# Patient Record
Sex: Male | Born: 1990 | Race: White | Hispanic: No | Smoking: Never smoker
Health system: Southern US, Community
[De-identification: ages and names within clinical notes are randomized; demographics above are authoritative.]

## PROBLEM LIST (undated history)

## (undated) DIAGNOSIS — E079 Disorder of thyroid, unspecified: Secondary | ICD-10-CM

---

## 2010-09-10 ENCOUNTER — Emergency Department (HOSPITAL_BASED_OUTPATIENT_CLINIC_OR_DEPARTMENT_OTHER)
Admission: EM | Admit: 2010-09-10 | Discharge: 2010-09-10 | Payer: Self-pay | Source: Home / Self Care | Admitting: Emergency Medicine

## 2010-11-24 LAB — DIFFERENTIAL
Lymphocytes Relative: 29 % (ref 12–46)
Lymphs Abs: 2.8 10*3/uL (ref 0.7–4.0)
Monocytes Absolute: 1.1 10*3/uL — ABNORMAL HIGH (ref 0.1–1.0)
Monocytes Relative: 11 % (ref 3–12)

## 2010-11-24 LAB — COMPREHENSIVE METABOLIC PANEL
AST: 30 U/L (ref 0–37)
Alkaline Phosphatase: 90 U/L (ref 39–117)
CO2: 27 mEq/L (ref 19–32)
Creatinine, Ser: 1.1 mg/dL (ref 0.4–1.5)
GFR calc Af Amer: >60 mL/min (ref >60)
GFR calc non Af Amer: >60 mL/min (ref >60)
Potassium: 4.2 mEq/L (ref 3.5–5.1)
Sodium: 147 mEq/L — ABNORMAL HIGH (ref 135–145)
Total Bilirubin: 1 mg/dL (ref 0.3–1.2)

## 2010-11-24 LAB — CBC
HCT: 45.6 % (ref 39.0–52.0)
Hemoglobin: 16.8 g/dL (ref 13.0–17.0)
MCHC: 36.8 g/dL — ABNORMAL HIGH (ref 30.0–36.0)
MCV: 80.9 fL (ref 78.0–100.0)
Platelets: 204 10*3/uL (ref 150–400)
RDW: 13.5 % (ref 11.5–15.5)

## 2010-11-24 LAB — URINALYSIS, ROUTINE W REFLEX MICROSCOPIC
Glucose, UA: NEGATIVE mg/dL
Hgb urine dipstick: NEGATIVE
pH: 6 (ref 5.0–8.0)

## 2010-11-24 LAB — LIPASE, BLOOD: Lipase: 73 U/L (ref 23–300)

## 2015-09-24 ENCOUNTER — Emergency Department (HOSPITAL_BASED_OUTPATIENT_CLINIC_OR_DEPARTMENT_OTHER)
Admission: EM | Admit: 2015-09-24 | Discharge: 2015-09-24 | Disposition: A | Discharge status: Home / Self Care | Payer: Worker's Compensation | Attending: Emergency Medicine | Admitting: Emergency Medicine

## 2015-09-24 ENCOUNTER — Encounter (HOSPITAL_BASED_OUTPATIENT_CLINIC_OR_DEPARTMENT_OTHER): Payer: Self-pay | Admitting: *Deleted

## 2015-09-24 ENCOUNTER — Emergency Department (HOSPITAL_BASED_OUTPATIENT_CLINIC_OR_DEPARTMENT_OTHER): Payer: Worker's Compensation

## 2015-09-24 DIAGNOSIS — S5002XA Contusion of left elbow, initial encounter: Secondary | ICD-10-CM | POA: Insufficient documentation

## 2015-09-24 DIAGNOSIS — Y9289 Other specified places as the place of occurrence of the external cause: Secondary | ICD-10-CM | POA: Insufficient documentation

## 2015-09-24 DIAGNOSIS — W01198A Fall on same level from slipping, tripping and stumbling with subsequent striking against other object, initial encounter: Secondary | ICD-10-CM | POA: Diagnosis not present

## 2015-09-24 DIAGNOSIS — S060X1A Concussion with loss of consciousness of 30 minutes or less, initial encounter: Secondary | ICD-10-CM | POA: Diagnosis not present

## 2015-09-24 DIAGNOSIS — Y998 Other external cause status: Secondary | ICD-10-CM | POA: Diagnosis not present

## 2015-09-24 DIAGNOSIS — S4491XA Injury of unspecified nerve at shoulder and upper arm level, right arm, initial encounter: Secondary | ICD-10-CM | POA: Insufficient documentation

## 2015-09-24 DIAGNOSIS — Z79899 Other long term (current) drug therapy: Secondary | ICD-10-CM | POA: Diagnosis not present

## 2015-09-24 DIAGNOSIS — S4492XA Injury of unspecified nerve at shoulder and upper arm level, left arm, initial encounter: Secondary | ICD-10-CM

## 2015-09-24 DIAGNOSIS — S39012A Strain of muscle, fascia and tendon of lower back, initial encounter: Secondary | ICD-10-CM | POA: Insufficient documentation

## 2015-09-24 DIAGNOSIS — Y9389 Activity, other specified: Secondary | ICD-10-CM | POA: Insufficient documentation

## 2015-09-24 DIAGNOSIS — S0990XA Unspecified injury of head, initial encounter: Secondary | ICD-10-CM | POA: Diagnosis present

## 2015-09-24 MED ORDER — KETOROLAC TROMETHAMINE 60 MG/2ML IM SOLN
60.0000 mg | Freq: Once | INTRAMUSCULAR | Status: AC
  Administered 2015-09-24: 60 mg via INTRAMUSCULAR
  Filled 2015-09-24: qty 2

## 2015-09-24 MED ORDER — ORPHENADRINE CITRATE ER 100 MG PO TB12: 100.0000 mg | ORAL_TABLET | Freq: Two times a day (BID) | ORAL | Status: AC

## 2015-09-24 MED ORDER — NAPROXEN 500 MG PO TABS: 500.0000 mg | ORAL_TABLET | Freq: Two times a day (BID) | ORAL | Status: AC

## 2015-09-24 MED ORDER — ACETAMINOPHEN 500 MG PO TABS
1000.0000 mg | ORAL_TABLET | Freq: Once | ORAL | Status: AC
  Administered 2015-09-24: 1000 mg via ORAL
  Filled 2015-09-24: qty 2

## 2015-09-24 MED ORDER — CYCLOBENZAPRINE HCL 10 MG PO TABS
10.0000 mg | ORAL_TABLET | Freq: Once | ORAL | Status: AC
  Administered 2015-09-24: 10 mg via ORAL
  Filled 2015-09-24: qty 1

## 2015-09-24 MED FILL — NAPROXEN 500 MG TABLET: 500 | 15 days supply | Qty: 30 | Fill #0

## 2015-09-24 MED FILL — ORPHENADRINE 100 MG TAB SA: 100 | 15 days supply | Qty: 30 | Fill #0

## 2015-09-24 NOTE — ED Notes (Signed)
Ice pack given

## 2015-09-24 NOTE — ED Notes (Signed)
States he was getting out of his truck and hit his head on ramp of truck. +LOC and states he was out about 3 min. C/o h/a, lower right back pain, and left elbow pain. Pt ambulatory to stretcher from w/c.

## 2015-09-24 NOTE — ED Provider Notes (Signed)
CSN: [id number]     Arrival date & time 09/24/15  0901 History   First MD Initiated Contact with Patient 09/24/15 [phone]     No chief complaint on file.    (Consider location/radiation/quality/duration/timing/severity/associated sxs/prior Treatment) HPI Patient reports he got out of his truck at work and slipped on ice. He fell backwards and hit his head on the running board of his truck and then hit his head on the ground. He reports that he was knocked out for about 3 minutes. He states he has a bad headache and his eyes are sensitive to light. He reports he has had no vomiting but has felt nauseated. He reports he also has a lot of pain in his left elbow. He is not sure if this is from tightly gripping the items that he had in his arm at the time of his fall or from directly striking it. He however reports that it hurts a lot if he moves his shoulder or his elbow and he is getting pain and tingling radiating down his forearm towards his hand. He reports also he has pain in his lower back more to the right than the left. This is exacerbated by movements. The patient has been able to ambulate and weight-bear although it is painful. Denies weakness or numbness into his legs. History reviewed. No pertinent past medical history. History reviewed. No pertinent past surgical history. No family history on file. Social History  Substance Use Topics  . Smoking status: Never Smoker   . Smokeless tobacco: None  . Alcohol Use: None    Review of Systems 10 Systems reviewed and are negative for acute change except as noted in the HPI.    Allergies  Review of patient's allergies indicates no known allergies.  Home Medications   Prior to Admission medications   Medication Sig Start Date End Date Taking? Authorizing Provider  citalopram (CELEXA) 10 MG tablet Take 10 mg by mouth daily.   Yes Historical Provider, MD  naproxen (NAPROSYN) 500 MG tablet Take 1 tablet (500 mg total) by mouth 2 (two) times  daily. 09/24/15   Arby Barrette, MD  orphenadrine (NORFLEX) 100 MG tablet Take 1 tablet (100 mg total) by mouth 2 (two) times daily. 09/24/15   Arby Barrette, MD   BP 123/80 mmHg  Pulse 66  Temp(Src) 98.4 F (36.9 C) (Oral)  Resp 16  Ht 5\' 8"  (1.727 m)  Wt 215 lb (97.523 kg)  BMI 32.70 kg/m2  SpO2 100% Physical Exam  Constitutional: He is oriented to person, place, and time. He appears well-developed and well-nourished.  HENT:  Head: Normocephalic and atraumatic.  Right Ear: External ear normal.  Left Ear: External ear normal.  Nose: Nose normal.  Mouth/Throat: Oropharynx is clear and moist.  Posterior occipital skull is tender but no palpable abnormality.  Eyes: EOM are normal. Pupils are equal, round, and reactive to light.  Neck: Neck supple.  C-spine tenderness at approximately C4 and C5.  Cardiovascular: Normal rate, regular rhythm, normal heart sounds and intact distal pulses.   Pulmonary/Chest: Effort normal and breath sounds normal. No respiratory distress. He exhibits no tenderness.  Abdominal: Soft. Bowel sounds are normal. He exhibits no distension. There is no tenderness.  Musculoskeletal: He exhibits tenderness. He exhibits no edema.  Pain with range of motion at the left elbow. No obvious deformity. No effusion at this time. Extremities neurovascularly intact. Patient is able to perform range of motion of the shoulder with abduction to 90+ degrees bilaterally  without difficulty. He is able to flex and extend at the elbow although this is painful. Bony point tenderness at the olecranon. Patient can stand and ambulate. He does endorse reproducible pain at the SI joint and the top of the iliac crest on the left. No visible abrasions or contusions to soft tissues.  Neurological: He is alert and oriented to person, place, and time. He has normal strength. Coordination normal. GCS eye subscore is 4. GCS verbal subscore is 5. GCS motor subscore is 6.  Normal cognitive function.  Lower extremity strength testing is 5 out of 5 in flexion and extension. Patient can elevate each extremity independently off of the bed. No endorsement of sensory differential to light touch. Upper extremity normal use of right upper extremity. Some range of motion limitations of left upper extremity due to pain.  Skin: Skin is warm, dry and intact.  Psychiatric: He has a normal mood and affect.    ED Course  Procedures (including critical care time) Labs Review Labs Reviewed - No data to display  Imaging Review Dg Lumbar Spine Complete  09/24/2015  CLINICAL DATA:  Status post fall on ice getting out of his truck this morning; low back pain ; unable to lie on the left side. EXAM: LUMBAR SPINE - COMPLETE 4+ VIEW COMPARISON:  Abdominal and pelvic CT scan of September 10, 2010 FINDINGS: The lumbar vertebral bodies are preserved in height. The pedicles and transverse processes are intact. There is no spondylolisthesis. The intervertebral disc space heights are well maintained. There is no significant facet joint hypertrophy. IMPRESSION: There is no acute bony abnormality of the lumbar spine. No significant degenerative change is observed either. Electronically Signed   By: David  SwazilandJordan M.D.   On: 09/24/2015 09:53   Dg Elbow Complete Left  09/24/2015  CLINICAL DATA:  Fall.  Initial evaluation. EXAM: LEFT ELBOW - COMPLETE 3+ VIEW COMPARISON:  None. FINDINGS: No acute bony or joint abnormality identified. No evidence of fracture or dislocation. IMPRESSION: No acute abnormality . Electronically Signed   By: Maisie Fushomas  Register   On: 09/24/2015 09:51   Ct Head Wo Contrast  09/24/2015  CLINICAL DATA:  Initial encounter for States he was getting out of his truck and hit his head on ramp of truck. +LOC and states he was out about 3 min. C/o h/a, lower right back pain, and left elbow pain. EXAM: CT HEAD WITHOUT CONTRAST CT CERVICAL SPINE WITHOUT CONTRAST TECHNIQUE: Multidetector CT imaging of the head and  cervical spine was performed following the standard protocol without intravenous contrast. Multiplanar CT image reconstructions of the cervical spine were also generated. COMPARISON:  None FINDINGS: CT HEAD FINDINGS Sinuses/Soft tissues: No significant soft tissue swelling. Clear paranasal sinuses and mastoid air cells. Intracranial: No mass lesion, hemorrhage, hydrocephalus, acute infarct, intra-axial, or extra-axial fluid collection. CT CERVICAL SPINE FINDINGS Spinal visualization through the the the bottom of T2. From approximately C7 inferiorly are suboptimally evaluated, due to overlying soft tissues and minimal motion. Prevertebral soft tissues are within normal limits. No apical pneumothorax. Skull base intact. Maintenance of vertebral body height and alignment. Facets are well-aligned. Coronal reformats demonstrate a normal C1-C2 articulation. IMPRESSION: 1. Normal head CT. 2. Suboptimal evaluation from C7 inferiorly secondary to minimal motion and overlying soft tissues. Given this factor, no acute finding in the cervical spine. Electronically Signed   By: Jeronimo GreavesKyle  Talbot M.D.   On: 09/24/2015 09:45   Ct Cervical Spine Wo Contrast  09/24/2015  CLINICAL DATA:  Initial encounter  for States he was getting out of his truck and hit his head on ramp of truck. +LOC and states he was out about 3 min. C/o h/a, lower right back pain, and left elbow pain. EXAM: CT HEAD WITHOUT CONTRAST CT CERVICAL SPINE WITHOUT CONTRAST TECHNIQUE: Multidetector CT imaging of the head and cervical spine was performed following the standard protocol without intravenous contrast. Multiplanar CT image reconstructions of the cervical spine were also generated. COMPARISON:  None FINDINGS: CT HEAD FINDINGS Sinuses/Soft tissues: No significant soft tissue swelling. Clear paranasal sinuses and mastoid air cells. Intracranial: No mass lesion, hemorrhage, hydrocephalus, acute infarct, intra-axial, or extra-axial fluid collection. CT CERVICAL  SPINE FINDINGS Spinal visualization through the the the bottom of T2. From approximately C7 inferiorly are suboptimally evaluated, due to overlying soft tissues and minimal motion. Prevertebral soft tissues are within normal limits. No apical pneumothorax. Skull base intact. Maintenance of vertebral body height and alignment. Facets are well-aligned. Coronal reformats demonstrate a normal C1-C2 articulation. IMPRESSION: 1. Normal head CT. 2. Suboptimal evaluation from C7 inferiorly secondary to minimal motion and overlying soft tissues. Given this factor, no acute finding in the cervical spine. Electronically Signed   By: Jeronimo Greaves M.D.   On: 09/24/2015 09:45   Dg Shoulder Left  09/24/2015  CLINICAL DATA:  Fall this morning on ice getting out of his car EXAM: LEFT SHOULDER - 2+ VIEW COMPARISON:  None. FINDINGS: Three views of left shoulder submitted. No acute fracture or subluxation. No radiopaque foreign body. IMPRESSION: Negative. Electronically Signed   By: Natasha Mead M.D.   On: 09/24/2015 09:52   I have personally reviewed and evaluated these images and lab results as part of my medical decision-making.   EKG Interpretation None      MDM   Final diagnoses:  Concussion, with loss of consciousness of 30 minutes or less, initial encounter  Elbow contusion, left, initial encounter  Lumbosacral strain, initial encounter  Neuropraxia of left upper extremity, initial encounter   Patient sustained a fall on ice with loss of consciousness and persisting headache. He also reports photophobia. Symptoms are consistent with concussion. CT does not show any intracranial bleed. Patient is alert and oriented 3. He has no cognitive deficit. Feel he is safe for discharge with concussion instructions. He also has pain in the left elbow particularly with pain radiating towards the hand and paresthesia. Findings are consistent with a nerve contusion injury. Function is however intact. Patient be given  instructions for sling for comfort and icing. He will need follow-up this week for recheck before returning to work. He also sustained low back strain but has no neurologic deficit.    Arby Barrette, MD 09/24/15 1022

## 2015-09-24 NOTE — Discharge Instructions (Signed)
Concussion, Adult A concussion, or closed-head injury, is a brain injury caused by a direct blow to the head or by a quick and sudden movement (jolt) of the head or neck. Concussions are usually not life-threatening. Even so, the effects of a concussion can be serious. If you have had a concussion before, you are more likely to experience concussion-like symptoms after a direct blow to the head.  CAUSES  Direct blow to the head, such as from running into another player during a soccer game, being hit in a fight, or hitting your head on a hard surface.  A jolt of the head or neck that causes the brain to move back and forth inside the skull, such as in a car crash. SIGNS AND SYMPTOMS The signs of a concussion can be hard to notice. Early on, they may be missed by you, family members, and health care providers. You may look fine but act or feel differently. Symptoms are usually temporary, but they may last for days, weeks, or even longer. Some symptoms may appear right away while others may not show up for hours or days. Every head injury is different. Symptoms include:  Mild to moderate headaches that will not go away.  A feeling of pressure inside your head.  Having more trouble than usual:  Learning or remembering things you have heard.  Answering questions.  Paying attention or concentrating.  Organizing daily tasks.  Making decisions and solving problems.  Slowness in thinking, acting or reacting, speaking, or reading.  Getting lost or being easily confused.  Feeling tired all the time or lacking energy (fatigued).  Feeling drowsy.  Sleep disturbances.  Sleeping more than usual.  Sleeping less than usual.  Trouble falling asleep.  Trouble sleeping (insomnia).  Loss of balance or feeling lightheaded or dizzy.  Nausea or vomiting.  Numbness or tingling.  Increased sensitivity to:  Sounds.  Lights.  Distractions.  Vision problems or eyes that tire  easily.  Diminished sense of taste or smell.  Ringing in the ears.  Mood changes such as feeling sad or anxious.  Becoming easily irritated or angry for little or no reason.  Lack of motivation.  Seeing or hearing things other people do not see or hear (hallucinations). DIAGNOSIS Your health care provider can usually diagnose a concussion based on a description of your injury and symptoms. He or she will ask whether you passed out (lost consciousness) and whether you are having trouble remembering events that happened right before and during your injury. Your evaluation might include:  A brain scan to look for signs of injury to the brain. Even if the test shows no injury, you may still have a concussion.  Blood tests to be sure other problems are not present. TREATMENT  Concussions are usually treated in an emergency department, in urgent care, or at a clinic. You may need to stay in the hospital overnight for further treatment.  Tell your health care provider if you are taking any medicines, including prescription medicines, over-the-counter medicines, and natural remedies. Some medicines, such as blood thinners (anticoagulants) and aspirin, may increase the chance of complications. Also tell your health care provider whether you have had alcohol or are taking illegal drugs. This information may affect treatment.  Your health care provider will send you home with important instructions to follow.  How fast you will recover from a concussion depends on many factors. These factors include how severe your concussion is, what part of your brain was injured,  your age, and how healthy you were before the concussion.  Most people with mild injuries recover fully. Recovery can take time. In general, recovery is slower in older persons. Also, persons who have had a concussion in the past or have other medical problems may find that it takes longer to recover from their current injury. HOME  CARE INSTRUCTIONS General Instructions  Carefully follow the directions your health care provider gave you.  Only take over-the-counter or prescription medicines for pain, discomfort, or fever as directed by your health care provider.  Take only those medicines that your health care provider has approved.  Do not drink alcohol until your health care provider says you are well enough to do so. Alcohol and certain other drugs may slow your recovery and can put you at risk of further injury.  If it is harder than usual to remember things, write them down.  If you are easily distracted, try to do one thing at a time. For example, do not try to watch TV while fixing dinner.  Talk with family members or close friends when making important decisions.  Keep all follow-up appointments. Repeated evaluation of your symptoms is recommended for your recovery.  Watch your symptoms and tell others to do the same. Complications sometimes occur after a concussion. Older adults with a brain injury may have a higher risk of serious complications, such as a blood clot on the brain.  Tell your teachers, school nurse, school counselor, coach, athletic trainer, or work Freight forwarder about your injury, symptoms, and restrictions. Tell them about what you can or cannot do. They should watch for:  Increased problems with attention or concentration.  Increased difficulty remembering or learning new information.  Increased time needed to complete tasks or assignments.  Increased irritability or decreased ability to cope with stress.  Increased symptoms.  Rest. Rest helps the brain to heal. Make sure you:  Get plenty of sleep at night. Avoid staying up late at night.  Keep the same bedtime hours on weekends and weekdays.  Rest during the day. Take daytime naps or rest breaks when you feel tired.  Limit activities that require a lot of thought or concentration. These include:  Doing homework or job-related  work.  Watching TV.  Working on the computer.  Avoid any situation where there is potential for another head injury (football, hockey, soccer, basketball, martial arts, downhill snow sports and horseback riding). Your condition will get worse every time you experience a concussion. You should avoid these activities until you are evaluated by the appropriate follow-up health care providers. Returning To Your Regular Activities You will need to return to your normal activities slowly, not all at once. You must give your body and brain enough time for recovery.  Do not return to sports or other athletic activities until your health care provider tells you it is safe to do so.  Ask your health care provider when you can drive, ride a bicycle, or operate heavy machinery. Your ability to react may be slower after a brain injury. Never do these activities if you are dizzy.  Ask your health care provider about when you can return to work or school. Preventing Another Concussion It is very important to avoid another brain injury, especially before you have recovered. In rare cases, another injury can lead to permanent brain damage, brain swelling, or death. The risk of this is greatest during the first 7-10 days after a head injury. Avoid injuries by:  Wearing a  seat belt when riding in a car.  Drinking alcohol only in moderation.  Wearing a helmet when biking, skiing, skateboarding, skating, or doing similar activities.  Avoiding activities that could lead to a second concussion, such as contact or recreational sports, until your health care provider says it is okay.  Taking safety measures in your home.  Remove clutter and tripping hazards from floors and stairways.  Use grab bars in bathrooms and handrails by stairs.  Place non-slip mats on floors and in bathtubs.  Improve lighting in dim areas. SEEK MEDICAL CARE IF:  You have increased problems paying attention or  concentrating.  You have increased difficulty remembering or learning new information.  You need more time to complete tasks or assignments than before.  You have increased irritability or decreased ability to cope with stress.  You have more symptoms than before. Seek medical care if you have any of the following symptoms for more than 2 weeks after your injury:  Lasting (chronic) headaches.  Dizziness or balance problems.  Nausea.  Vision problems.  Increased sensitivity to noise or light.  Depression or mood swings.  Anxiety or irritability.  Memory problems.  Difficulty concentrating or paying attention.  Sleep problems.  Feeling tired all the time. SEEK IMMEDIATE MEDICAL CARE IF:  You have severe or worsening headaches. These may be a sign of a blood clot in the brain.  You have weakness (even if only in one hand, leg, or part of the face).  You have numbness.  You have decreased coordination.  You vomit repeatedly.  You have increased sleepiness.  One pupil is larger than the other.  You have convulsions.  You have slurred speech.  You have increased confusion. This may be a sign of a blood clot in the brain.  You have increased restlessness, agitation, or irritability.  You are unable to recognize people or places.  You have neck pain.  It is difficult to wake you up.  You have unusual behavior changes.  You lose consciousness. MAKE SURE YOU:  Understand these instructions.  Will watch your condition.  Will get help right away if you are not doing well or get worse.   This information is not intended to replace advice given to you by your health care provider. Make sure you discuss any questions you have with your health care provider.   Document Released: 11/21/2003 Document Revised: 09/21/2014 Document Reviewed: 03/23/2013 Elsevier Interactive Patient Education 2016 [employer] Lumbosacral Strain Lumbosacral strain is a strain  of any of the parts that make up your lumbosacral vertebrae. Your lumbosacral vertebrae are the bones that make up the lower third of your backbone. Your lumbosacral vertebrae are held together by muscles and tough, fibrous tissue (ligaments).  CAUSES  A sudden blow to your back can cause lumbosacral strain. Also, anything that causes an excessive stretch of the muscles in the low back can cause this strain. This is typically seen when people exert themselves strenuously, fall, lift heavy objects, bend, or crouch repeatedly. RISK FACTORS  Physically demanding work.  Participation in pushing or pulling sports or sports that require a sudden twist of the back (tennis, golf, baseball).  Weight lifting.  Excessive lower back curvature.  Forward-tilted pelvis.  Weak back or abdominal muscles or both.  Tight hamstrings. SIGNS AND SYMPTOMS  Lumbosacral strain may cause pain in the area of your injury or pain that moves (radiates) down your leg.  DIAGNOSIS Your health care provider can often diagnose lumbosacral strain  through a physical exam. In some cases, you may need tests such as X-ray exams.  TREATMENT  Treatment for your lower back injury depends on many factors that your clinician will have to evaluate. However, most treatment will include the use of anti-inflammatory medicines. HOME CARE INSTRUCTIONS   Avoid hard physical activities (tennis, racquetball, waterskiing) if you are not in proper physical condition for it. This may aggravate or create problems.  If you have a back problem, avoid sports requiring sudden body movements. Swimming and walking are generally safer activities.  Maintain good posture.  Maintain a healthy weight.  For acute conditions, you may put ice on the injured area.  Put ice in a plastic bag.  Place a towel between your skin and the bag.  Leave the ice on for 20 minutes, 2-3 times a day.  When the low back starts healing, stretching and  strengthening exercises may be recommended. SEEK MEDICAL CARE IF:  Your back pain is getting worse.  You experience severe back pain not relieved with medicines. SEEK IMMEDIATE MEDICAL CARE IF:   You have numbness, tingling, weakness, or problems with the use of your arms or legs.  There is a change in bowel or bladder control.  You have increasing pain in any area of the body, including your belly (abdomen).  You notice shortness of breath, dizziness, or feel faint.  You feel sick to your stomach (nauseous), are throwing up (vomiting), or become sweaty.  You notice discoloration of your toes or legs, or your feet get very cold. MAKE SURE YOU:   Understand these instructions.  Will watch your condition.  Will get help right away if you are not doing well or get worse.   This information is not intended to replace advice given to you by your health care provider. Make sure you discuss any questions you have with your health care provider.   Document Released: 06/10/2005 Document Revised: 09/21/2014 Document Reviewed: 04/19/2013 Elsevier Interactive Patient Education 2016 [employer] Information for Neurapraxia (nerve injury in your elbow) Neurapraxia is a temporary loss of nerve function. It does not cause permanent damage to a nerve. If your foot "falls asleep," that is a type of neurapraxia. It will go away as soon as you start moving your foot. A more serious neurapraxia could take up to 6 weeks to go away. CAUSES   Anything that strains a nerve can cause neurapraxia. The nerve might be stretched or twisted. Something might press or pound on it and cause decreased blood flow to the nerve. Causes of neurapraxia can include:  Bones that break (fracture) or move out of place (dislocation). This can cause the nerves to stretch or twist out of their normal position. This happens most often in the arms and shoulders.  Neck strain when the head moves suddenly, such as in a car  crash. This is called whiplash (cervical neurapraxia). It can also happen while playing sports. For example, a football injury called a stinger is a type of neurapraxia. It causes severe pain to shoot down an arm.  Stretching the neck too far from the shoulder. This damages the nerves that go into the shoulder, arm, and hand. It causes numbness and muscle weakness. This condition is called brachial plexus neurapraxia.  Repeated or prolonged pressure can cause decreased blood flow to the nerve (ischemic neurapraxia). Such causes of neurapraxia can include:  A cast or bandage that is too tight around an injured arm or leg. This limits blood flow  to the nerves and causes a condition called compartment syndrome. This condition develops when pressure builds up around muscles and nerves.  Infection and disease. This can cut off blood flow to the nerve.  Very cold temperatures.  Sleeping in a position that limits blood flow to your leg or arm. SIGNS AND SYMPTOMS  Numbness and tingling.  Muscle weakness.  Burning pain.  Cool skin. DIAGNOSIS  Neurapraxia is diagnosed through:  A physical exam. This will include asking questions about your health. Your health care provider will ask about any symptoms you are having. Your health care provider may also:  Test how strong your muscles are.  Check feeling (sensation) in various areas of your body. A very light touch or pricks with a pin may be used.  Check whether you have signs of nerve damage on one side of the body or both.  Tests such as:  Electromyography (EMG). This test measures electrical activity in a muscle. It shows whether the nerve that supplies the muscle is working.  Nerve conduction studies (NCS). They measure the flow of electricity through a nerve.  Magnetic resonance imaging (MRI). This is a machine that uses magnets and a computer to create pictures of your nerves. TREATMENT  Treatment aims to ease pain and swelling and  to provide support while your body heals.  Medicine may include:  Pain medicine.  Antidepressants.  Seizure medicine.  Medicine to reduce swelling.  For support, options may include:  Braces, walkers, or crutches.  Physical therapy. Having a specialist work with you often speeds healing. It can also help prevent stiffness and future damage.  Surgery. This may be needed if broken or dislocated bones are part of the problem. Surgery also may be done to relieve pressure on nerves or to restore normal blood flow to nerves and muscles.  Electrical stimulators. These devices send pulses of electricity into the muscles. The aim is to bring back movement. HOME CARE INSTRUCTIONS What you need to do at home will vary. It will depend on your treatment plan and the type of neurapraxia you have. In general:  Take medicine as told by your health care provider. Follow the directions carefully.  Rest. Give your body time to heal.  Use any splints, braces, or other support devices as directed. If you have questions about their use, ask your health care provider.  Start physical therapy, if that is suggested. Ask if it is okay to practice the exercises at home, too.  If areas of your body are numb, take care to protect them from burns or other injury.  If you had surgery, you will need to care for your surgical cut (incision). Ask for instructions before you leave the hospital.  Keep all follow-up appointments with your health care provider. SEEK MEDICAL CARE IF:   You have any questions about your medicine.  Numbness or muscle weakness continues.  Pain continues, even after taking pain medicine. SEEK IMMEDIATE MEDICAL CARE IF:   Your pain suddenly becomes severe.  Numbness or weakness gets much worse.  Your muscles start to twitch or you have muscle spasms.   This information is not intended to replace advice given to you by your health care provider. Make sure you discuss any  questions you have with your health care provider.   Document Released: 02/02/2011 Document Revised: 09/21/2014 Document Reviewed: 03/04/2015 Elsevier Interactive Patient Education [employer]

## 2017-07-15 ENCOUNTER — Ambulatory Visit: Payer: Self-pay | Admitting: Podiatry

## 2017-10-08 ENCOUNTER — Emergency Department (HOSPITAL_BASED_OUTPATIENT_CLINIC_OR_DEPARTMENT_OTHER): Payer: BLUE CROSS/BLUE SHIELD

## 2017-10-08 ENCOUNTER — Emergency Department (HOSPITAL_BASED_OUTPATIENT_CLINIC_OR_DEPARTMENT_OTHER)
Admission: EM | Admit: 2017-10-08 | Discharge: 2017-10-09 | Disposition: A | Discharge status: Home / Self Care | Payer: BLUE CROSS/BLUE SHIELD | Attending: Emergency Medicine | Admitting: Emergency Medicine

## 2017-10-08 ENCOUNTER — Encounter (HOSPITAL_BASED_OUTPATIENT_CLINIC_OR_DEPARTMENT_OTHER): Payer: Self-pay | Admitting: *Deleted

## 2017-10-08 DIAGNOSIS — S0033XA Contusion of nose, initial encounter: Secondary | ICD-10-CM | POA: Diagnosis not present

## 2017-10-08 DIAGNOSIS — Y939 Activity, unspecified: Secondary | ICD-10-CM | POA: Insufficient documentation

## 2017-10-08 DIAGNOSIS — Y929 Unspecified place or not applicable: Secondary | ICD-10-CM | POA: Insufficient documentation

## 2017-10-08 DIAGNOSIS — Z79899 Other long term (current) drug therapy: Secondary | ICD-10-CM | POA: Diagnosis not present

## 2017-10-08 DIAGNOSIS — Y999 Unspecified external cause status: Secondary | ICD-10-CM | POA: Diagnosis not present

## 2017-10-08 DIAGNOSIS — W51XXXA Accidental striking against or bumped into by another person, initial encounter: Secondary | ICD-10-CM | POA: Insufficient documentation

## 2017-10-08 DIAGNOSIS — S0992XA Unspecified injury of nose, initial encounter: Secondary | ICD-10-CM | POA: Diagnosis present

## 2017-10-08 DIAGNOSIS — R040 Epistaxis: Secondary | ICD-10-CM
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
## 2019-03-06 ENCOUNTER — Other Ambulatory Visit: Payer: Self-pay

## 2019-03-06 ENCOUNTER — Emergency Department (HOSPITAL_BASED_OUTPATIENT_CLINIC_OR_DEPARTMENT_OTHER): Payer: BC Managed Care – PPO

## 2019-03-06 ENCOUNTER — Emergency Department (HOSPITAL_BASED_OUTPATIENT_CLINIC_OR_DEPARTMENT_OTHER)
Admission: EM | Admit: 2019-03-06 | Discharge: 2019-03-07 | Disposition: A | Discharge status: Home / Self Care | Payer: BC Managed Care – PPO | Attending: Emergency Medicine | Admitting: Emergency Medicine

## 2019-03-06 ENCOUNTER — Encounter (HOSPITAL_BASED_OUTPATIENT_CLINIC_OR_DEPARTMENT_OTHER): Payer: Self-pay

## 2019-03-06 DIAGNOSIS — H9221 Otorrhagia, right ear: Secondary | ICD-10-CM | POA: Diagnosis not present

## 2019-03-06 DIAGNOSIS — Y929 Unspecified place or not applicable: Secondary | ICD-10-CM | POA: Diagnosis not present

## 2019-03-06 DIAGNOSIS — Y9389 Activity, other specified: Secondary | ICD-10-CM | POA: Insufficient documentation

## 2019-03-06 DIAGNOSIS — Y998 Other external cause status: Secondary | ICD-10-CM | POA: Diagnosis not present

## 2019-03-06 DIAGNOSIS — S0990XA Unspecified injury of head, initial encounter: Secondary | ICD-10-CM

## 2019-03-06 DIAGNOSIS — S060X1A Concussion with loss of consciousness of 30 minutes or less, initial encounter: Secondary | ICD-10-CM | POA: Diagnosis not present

## 2019-03-06 DIAGNOSIS — W500XXA Accidental hit or strike by another person, initial encounter: Secondary | ICD-10-CM | POA: Insufficient documentation

## 2019-03-06 DIAGNOSIS — Z79899 Other long term (current) drug therapy: Secondary | ICD-10-CM | POA: Diagnosis not present

## 2019-03-06 HISTORY — DX: Disorder of thyroid, unspecified: E07.9

## 2019-03-06 MED ORDER — ACETAMINOPHEN 500 MG PO TABS
1000.0000 mg | ORAL_TABLET | Freq: Once | ORAL | Status: AC
  Administered 2019-03-07: 1000 mg via ORAL
  Filled 2019-03-06: qty 2
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
## 2019-03-06 NOTE — Discharge Instructions (Signed)
It was our pleasure to provide your ER care today - we hope that you feel better.  Rest. Drink plenty of fluids.  Take acetaminophen as need for pain.  Follow up with primary care doctor in 1-2 weeks.  No contact sports until cleared to do by primary care doctor.   Return to ER if worse, new symptoms, new or severe pain, other concern.
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
## 2021-03-20 ENCOUNTER — Emergency Department (HOSPITAL_BASED_OUTPATIENT_CLINIC_OR_DEPARTMENT_OTHER)
Admission: EM | Admit: 2021-03-20 | Discharge: 2021-03-20 | Disposition: A | Discharge status: Home / Self Care | Payer: BC Managed Care – PPO | Attending: Emergency Medicine | Admitting: Emergency Medicine

## 2021-03-20 ENCOUNTER — Encounter (HOSPITAL_BASED_OUTPATIENT_CLINIC_OR_DEPARTMENT_OTHER): Payer: Self-pay | Admitting: *Deleted

## 2021-03-20 ENCOUNTER — Other Ambulatory Visit: Payer: Self-pay

## 2021-03-20 DIAGNOSIS — X58XXXA Exposure to other specified factors, initial encounter: Secondary | ICD-10-CM | POA: Insufficient documentation

## 2021-03-20 DIAGNOSIS — H60502 Unspecified acute noninfective otitis externa, left ear: Secondary | ICD-10-CM | POA: Diagnosis not present

## 2021-03-20 DIAGNOSIS — T162XXA Foreign body in left ear, initial encounter: Secondary | ICD-10-CM | POA: Diagnosis present

## 2021-03-20 MED ORDER — AMOXICILLIN 500 MG PO CAPS: 500.0000 mg | ORAL_CAPSULE | Freq: Three times a day (TID) | ORAL | 0 refills | Status: AC

## 2021-03-20 MED ORDER — OFLOXACIN 0.3 % OT SOLN: 5.0000 [drp] | Freq: Two times a day (BID) | OTIC | 0 refills | Status: AC

## 2021-03-20 MED ORDER — CIPROFLOXACIN-DEXAMETHASONE 0.3-0.1 % OT SUSP
4.0000 [drp] | Freq: Once | OTIC | Status: AC
  Administered 2021-03-20: 4 [drp] via OTIC
  Filled 2021-03-20: qty 7.5

## 2021-03-20 NOTE — ED Provider Notes (Signed)
[facility] EMERGENCY DEPARTMENT Provider Note   CSN: [id number] Arrival date & time: 03/20/21  1422    History Chief Complaint  Patient presents with   Foreign Body in Ear    Dennis Macdonald is a 30 y.o. male with past medical history who presents for evaluation of sensation of foreign body to his left ear.  Began yesterday.  Intermittent in nature.  Feels like he has an ear infection. Attempted to have coworker look in ear however did not seen anything. No recent swimming activities.  No drainage.  No pulsatile tinnitus, neck pain, neck stiffness, headache, paresthesias or weakness.  Denies additional aggravating or alleviating factors. Rates pain a 5/10.   History obtained from patient and past medical records. No interpretor was used.  HPI     Past Medical History:  Diagnosis Date   Thyroid disease     There are no problems to display for this patient.   History reviewed. No pertinent surgical history.     No family history on file.  Social History   Tobacco Use   Smoking status: Never   Smokeless tobacco: Never  Vaping Use   Vaping Use: Never used  Substance Use Topics   Alcohol use: Yes    Comment: occ   Drug use: No    Home Medications Prior to Admission medications   Medication Sig Start Date End Date Taking? Authorizing Provider  amoxicillin (AMOXIL) 500 MG capsule Take 1 capsule (500 mg total) by mouth 3 (three) times daily for 7 days. 03/20/21 03/27/21 Yes Torrie Lafavor A, PA-C  levothyroxine (SYNTHROID) 125 MCG tablet Take by mouth. 01/02/21 01/02/22 Yes [provider]  ofloxacin (FLOXIN) 0.3 % OTIC solution Place 5 drops into the left ear 2 (two) times daily. 03/20/21  Yes Adraine Biffle A, PA-C  citalopram (CELEXA) 10 MG tablet Take 10 mg by mouth daily.    [provider]  naproxen (NAPROSYN) 500 MG tablet Take 1 tablet (500 mg total) by mouth 2 (two) times daily. 09/24/15   Arby Barrette, MD  orphenadrine (NORFLEX) 100 MG  tablet Take 1 tablet (100 mg total) by mouth 2 (two) times daily. 09/24/15   Arby Barrette, MD    Allergies    Patient has no known allergies.  Review of Systems   Review of Systems  Constitutional: Negative.   HENT:  Positive for ear pain. Negative for congestion, dental problem, drooling, ear discharge, facial swelling, hearing loss, postnasal drip, rhinorrhea, sinus pain, sore throat, trouble swallowing and voice change.        Sensation of foreign body to left ear  Respiratory: Negative.    Cardiovascular: Negative.   Gastrointestinal: Negative.   Genitourinary: Negative.   Musculoskeletal: Negative.   Skin: Negative.   Neurological: Negative.   All other systems reviewed and are negative.  Physical Exam Updated Vital Signs BP 136/87 (BP Location: Left Arm)   Pulse 91   Temp 98.4 F (36.9 C) (Oral)   Resp (!) 21   Ht 5\' 7"  (1.702 m)   Wt 108.9 kg   SpO2 97%   BMI 37.59 kg/m   Physical Exam Vitals and nursing note reviewed.  Constitutional:      General: He is not in acute distress.    Appearance: He is well-developed. He is not ill-appearing, toxic-appearing or diaphoretic.  HENT:     Head: Normocephalic and atraumatic.     Right Ear: Tympanic membrane, ear canal and external ear normal. There is no impacted  cerumen.     Left Ear: Drainage, swelling and tenderness present. No laceration. A middle ear effusion is present. There is no impacted cerumen. No foreign body. No mastoid tenderness. Tympanic membrane is erythematous and bulging. Tympanic membrane is not perforated or retracted.     Ears:     Comments: Left ear without tenderness to mastoid. Some mild dried discharge to canal. Mid ear effusion with some edema and erythema. Some macerated skin. No obvious foreign body to ear. TM with some mild bulge without perforation. Eyes:     Pupils: Pupils are equal, round, and reactive to light.  Neck:     Trachea: Trachea and phonation normal.     Comments: Full ROM  without difficulty Cardiovascular:     Rate and Rhythm: Normal rate and regular rhythm.  Pulmonary:     Effort: Pulmonary effort is normal. No respiratory distress.  Abdominal:     General: There is no distension.     Palpations: Abdomen is soft.  Musculoskeletal:        General: Normal range of motion.     Cervical back: Full passive range of motion without pain, normal range of motion and neck supple.  Lymphadenopathy:     Cervical: No cervical adenopathy.  Skin:    General: Skin is warm and dry.  Neurological:     General: No focal deficit present.     Mental Status: He is alert and oriented to person, place, and time.    ED Results / Procedures / Treatments   Labs (all labs ordered are listed, but only abnormal results are displayed) Labs Reviewed - No data to display  EKG None  Radiology No results found.  Procedures .Ear Cerumen Removal  Date/Time: 03/20/2021 4:04 PM Performed by: Linwood Dibbles, PA-C Authorized by: Linwood Dibbles, PA-C   Consent:    Consent obtained:  Verbal   Consent given by:  Patient   Risks discussed:  Bleeding, dizziness, incomplete removal, infection, pain and TM perforation   Alternatives discussed:  No treatment Universal protocol:    Procedure explained and questions answered to patient or proxy's satisfaction: yes     Relevant documents present and verified: yes     Test results available: yes     Imaging studies available: yes     Required blood products, implants, devices, and special equipment available: yes     Site/side marked: yes     Immediately prior to procedure, a time out was called: yes     Patient identity confirmed:  Verbally with patient Procedure details:    Location:  L ear   Procedure type: irrigation     Successful cerumen removal: No visualized foreign object with irrigation.   Post-procedure details:    Procedure completion:  Tolerated well, no immediate complications   Medications Ordered in  ED Medications  ciprofloxacin-dexamethasone (CIPRODEX) 0.3-0.1 % OTIC (EAR) suspension 4 drop (4 drops Left EAR Given 03/20/21 1552)    ED Course  I have reviewed the triage vital signs and the nursing notes.  Pertinent labs & imaging results that were available during my care of the patient were reviewed by me and considered in my medical decision making (see chart for details).  30 year old here for evaluation of left ear pain and sensation of foreign body.  Afebrile, nonseptic, not ill-appearing.  No pulsatile tinnitus.  Nonfocal neuro exam without deficits.  No neck stiffness or neck rigidity.  No meningismus.  Right ear clear.  Left ear with  some mild.  He has erythematous, macerated skin which may be effusion.  Left TM with some bulge.  No canal occlusion. Exam non concerning for mastoiditis, cellulitis or malignant OE. Dc with Abx. Advised follow up in 2-3 days if no improvement with treatment or no complete resolution by 7 days.   No obvious foreign object on exam.  Were able to irrigate his ear without any obvious foreign body at that time.  Likely sensation from otitis externa.  The patient has been appropriately medically screened and/or stabilized in the ED. I have low suspicion for any other emergent medical condition which would require further screening, evaluation or treatment in the ED or require inpatient management.  Patient is hemodynamically stable and in no acute distress.  Patient able to ambulate in department prior to ED.  Evaluation does not show acute pathology that would require ongoing or additional emergent interventions while in the emergency department or further inpatient treatment.  I have discussed the diagnosis with the patient and answered all questions.  Pain is been managed while in the emergency department and patient has no further complaints prior to discharge.  Patient is comfortable with plan discussed in room and is stable for discharge at this time.  I have  discussed strict return precautions for returning to the emergency department.  Patient was encouraged to follow-up with PCP/specialist refer to at discharge.     MDM Rules/Calculators/A&P                           Final Clinical Impression(s) / ED Diagnoses Final diagnoses:  Acute otitis externa of left ear, unspecified type    Rx / DC Orders ED Discharge Orders          Ordered    amoxicillin (AMOXIL) 500 MG capsule  3 times daily        03/20/21 1601    ofloxacin (FLOXIN) 0.3 % OTIC solution  2 times daily        03/20/21 1601             Minh Roanhorse A, PA-C 03/20/21 1605    Benjiman Core, MD 03/20/21 2354

## 2021-03-20 NOTE — ED Triage Notes (Signed)
He feels like he has an insect in his left ear.

## 2021-03-20 NOTE — Discharge Instructions (Addendum)
Take the antibiotics as prescribed.  Use the drops given to you here in the ED twice daily to left ear. Once those drops are complete, switch to the Floxin drops twice diaily for 5 days. Take the oral antibiotics as well.  Follow up with Primary care provider in 2 days if symptoms do not improve
